# Patient Record
Sex: Male | Born: 1995 | Race: Black or African American | Hispanic: No | Smoking: Current some day smoker
Health system: Southern US, Community
[De-identification: ages and names within clinical notes are randomized; demographics above are authoritative.]

---

## 2016-01-21 ENCOUNTER — Emergency Department (HOSPITAL_COMMUNITY): Payer: Self-pay

## 2016-01-21 ENCOUNTER — Emergency Department (HOSPITAL_COMMUNITY): Payer: No Typology Code available for payment source | Admitting: Anesthesiology

## 2016-01-21 ENCOUNTER — Encounter (HOSPITAL_COMMUNITY)
Admission: EM | Disposition: A | Discharge status: Home / Self Care | Payer: Self-pay | Source: Home / Self Care | Attending: Emergency Medicine

## 2016-01-21 ENCOUNTER — Ambulatory Visit (HOSPITAL_COMMUNITY)
Admission: EM | Admit: 2016-01-21 | Discharge: 2016-01-21 | Disposition: A | Discharge status: Home / Self Care | Payer: Self-pay | Attending: Emergency Medicine | Admitting: Emergency Medicine

## 2016-01-21 ENCOUNTER — Encounter (HOSPITAL_COMMUNITY): Payer: Self-pay | Admitting: Emergency Medicine

## 2016-01-21 DIAGNOSIS — S022XXA Fracture of nasal bones, initial encounter for closed fracture: Secondary | ICD-10-CM | POA: Insufficient documentation

## 2016-01-21 DIAGNOSIS — S01112A Laceration without foreign body of left eyelid and periocular area, initial encounter: Secondary | ICD-10-CM | POA: Insufficient documentation

## 2016-01-21 DIAGNOSIS — F17200 Nicotine dependence, unspecified, uncomplicated: Secondary | ICD-10-CM | POA: Insufficient documentation

## 2016-01-21 DIAGNOSIS — S0121XA Laceration without foreign body of nose, initial encounter: Secondary | ICD-10-CM | POA: Insufficient documentation

## 2016-01-21 DIAGNOSIS — S022XXB Fracture of nasal bones, initial encounter for open fracture: Secondary | ICD-10-CM

## 2016-01-21 DIAGNOSIS — S01511A Laceration without foreign body of lip, initial encounter: Secondary | ICD-10-CM | POA: Insufficient documentation

## 2016-01-21 DIAGNOSIS — S0003XA Contusion of scalp, initial encounter: Secondary | ICD-10-CM | POA: Insufficient documentation

## 2016-01-21 DIAGNOSIS — J342 Deviated nasal septum: Secondary | ICD-10-CM

## 2016-01-21 DIAGNOSIS — S0181XA Laceration without foreign body of other part of head, initial encounter: Secondary | ICD-10-CM | POA: Insufficient documentation

## 2016-01-21 DIAGNOSIS — S0182XA Laceration with foreign body of other part of head, initial encounter: Secondary | ICD-10-CM

## 2016-01-21 DIAGNOSIS — S01521A Laceration with foreign body of lip, initial encounter: Secondary | ICD-10-CM

## 2016-01-21 HISTORY — PX: FACIAL LACERATION REPAIR: SHX6589

## 2016-01-21 LAB — COMPREHENSIVE METABOLIC PANEL
ALT: 24 U/L (ref 17–63)
ANION GAP: 13 (ref 5–15)
AST: 24 U/L (ref 15–41)
Albumin: 4.7 g/dL (ref 3.5–5.0)
Alkaline Phosphatase: 97 U/L (ref 38–126)
BILIRUBIN TOTAL: 0.3 mg/dL (ref 0.3–1.2)
BUN: 11 mg/dL (ref 6–20)
CO2: 27 mmol/L (ref 22–32)
CREATININE: 1.18 mg/dL (ref 0.61–1.24)
Calcium: 9.7 mg/dL (ref 8.9–10.3)
Chloride: 103 mmol/L (ref 101–111)
GFR calc Af Amer: >60 mL/min (ref >60)
Glucose, Bld: 116 mg/dL — ABNORMAL HIGH (ref 65–99)
POTASSIUM: 4.2 mmol/L (ref 3.5–5.1)
Sodium: 143 mmol/L (ref 135–145)
Total Protein: 7.1 g/dL (ref 6.5–8.1)

## 2016-01-21 LAB — URINALYSIS, ROUTINE W REFLEX MICROSCOPIC
Bilirubin Urine: NEGATIVE
Glucose, UA: NEGATIVE mg/dL
Hgb urine dipstick: NEGATIVE
Ketones, ur: NEGATIVE mg/dL
LEUKOCYTES UA: NEGATIVE
NITRITE: NEGATIVE
PROTEIN: NEGATIVE mg/dL
Specific Gravity, Urine: 1.028 (ref 1.005–1.030)
pH: 6 (ref 5.0–8.0)

## 2016-01-21 LAB — I-STAT CHEM 8, ED
BUN: 15 mg/dL (ref 6–20)
CREATININE: 1.4 mg/dL — AB (ref 0.61–1.24)
Calcium, Ion: 1.21 mmol/L (ref 1.12–1.23)
Chloride: 102 mmol/L (ref 101–111)
Glucose, Bld: 114 mg/dL — ABNORMAL HIGH (ref 65–99)
HEMATOCRIT: 53 % — AB (ref 39.0–52.0)
HEMOGLOBIN: 18 g/dL — AB (ref 13.0–17.0)
POTASSIUM: 4 mmol/L (ref 3.5–5.1)
SODIUM: 144 mmol/L (ref 135–145)
TCO2: 28 mmol/L (ref 0–100)

## 2016-01-21 LAB — CBC
HCT: 47.8 % (ref 39.0–52.0)
Hemoglobin: 15.9 g/dL (ref 13.0–17.0)
MCH: 29 pg (ref 26.0–34.0)
MCHC: 33.3 g/dL (ref 30.0–36.0)
MCV: 87.2 fL (ref 78.0–100.0)
PLATELETS: 208 10*3/uL (ref 150–400)
RBC: 5.48 MIL/uL (ref 4.22–5.81)
RDW: 12.9 % (ref 11.5–15.5)
WBC: 6.3 10*3/uL (ref 4.0–10.5)

## 2016-01-21 LAB — ETHANOL: Alcohol, Ethyl (B): 183 mg/dL — ABNORMAL HIGH (ref <5)

## 2016-01-21 LAB — PROTIME-INR
INR: 1.1 (ref 0.00–1.49)
Prothrombin Time: 14.4 seconds (ref 11.6–15.2)

## 2016-01-21 LAB — SAMPLE TO BLOOD BANK

## 2016-01-21 SURGERY — REPAIR, LACERATION, FACE
Anesthesia: General | Site: Face

## 2016-01-21 MED ORDER — FENTANYL CITRATE (PF) 250 MCG/5ML IJ SOLN
INTRAMUSCULAR | Status: AC
  Filled 2016-01-21: qty 5

## 2016-01-21 MED ORDER — DEXMEDETOMIDINE HCL IN NACL 200 MCG/50ML IV SOLN
INTRAVENOUS | Status: DC | PRN
  Administered 2016-01-21: .5 ug/kg/h via INTRAVENOUS

## 2016-01-21 MED ORDER — BACITRACIN ZINC 500 UNIT/GM EX OINT
TOPICAL_OINTMENT | CUTANEOUS | Status: AC
  Filled 2016-01-21: qty 28.35

## 2016-01-21 MED ORDER — ONDANSETRON HCL 4 MG/2ML IJ SOLN
INTRAMUSCULAR | Status: AC
  Filled 2016-01-21: qty 2

## 2016-01-21 MED ORDER — CLINDAMYCIN HCL 300 MG PO CAPS: 300.0000 mg | ORAL_CAPSULE | Freq: Four times a day (QID) | ORAL | Status: DC

## 2016-01-21 MED ORDER — SODIUM CHLORIDE 0.9 % IR SOLN
Status: DC | PRN
  Administered 2016-01-21: 500 mL

## 2016-01-21 MED ORDER — MIDAZOLAM HCL 2 MG/2ML IJ SOLN
INTRAMUSCULAR | Status: AC
  Filled 2016-01-21: qty 2

## 2016-01-21 MED ORDER — OXYCODONE HCL 5 MG/5ML PO SOLN: 5.0000 mg | Freq: Once | ORAL | Status: DC | PRN

## 2016-01-21 MED ORDER — PROPOFOL 10 MG/ML IV BOLUS
INTRAVENOUS | Status: AC
  Filled 2016-01-21: qty 20

## 2016-01-21 MED ORDER — ONDANSETRON HCL 4 MG/2ML IJ SOLN: 4.0000 mg | Freq: Four times a day (QID) | INTRAMUSCULAR | Status: DC | PRN

## 2016-01-21 MED ORDER — FENTANYL CITRATE (PF) 100 MCG/2ML IJ SOLN
INTRAMUSCULAR | Status: DC | PRN
  Administered 2016-01-21 (×2): 50 ug via INTRAVENOUS

## 2016-01-21 MED ORDER — ARTIFICIAL TEARS OP OINT
TOPICAL_OINTMENT | OPHTHALMIC | Status: AC
  Filled 2016-01-21: qty 3.5

## 2016-01-21 MED ORDER — SODIUM CHLORIDE 0.9 % IR SOLN
Status: DC | PRN
  Administered 2016-01-21: 1000 mL

## 2016-01-21 MED ORDER — SODIUM CHLORIDE 0.9 % IV SOLN
1000.0000 mL | INTRAVENOUS | Status: DC
  Administered 2016-01-21: 1000 mL via INTRAVENOUS

## 2016-01-21 MED ORDER — OXYMETAZOLINE HCL 0.05 % NA SOLN
NASAL | Status: AC
  Filled 2016-01-21: qty 15

## 2016-01-21 MED ORDER — BSS IO SOLN
INTRAOCULAR | Status: DC | PRN
  Administered 2016-01-21: 15 mL

## 2016-01-21 MED ORDER — LACTATED RINGERS IV SOLN
INTRAVENOUS | Status: DC | PRN
  Administered 2016-01-21 (×3): via INTRAVENOUS

## 2016-01-21 MED ORDER — OXYMETAZOLINE HCL 0.05 % NA SOLN
NASAL | Status: DC | PRN
  Administered 2016-01-21: 1 via TOPICAL

## 2016-01-21 MED ORDER — OXYCODONE-ACETAMINOPHEN 5-325 MG PO TABS: 1.0000 | ORAL_TABLET | Freq: Four times a day (QID) | ORAL | Status: DC | PRN

## 2016-01-21 MED ORDER — HYDROMORPHONE HCL 1 MG/ML IJ SOLN: 0.2500 mg | INTRAMUSCULAR | Status: DC | PRN

## 2016-01-21 MED ORDER — DEXMEDETOMIDINE HCL IN NACL 200 MCG/50ML IV SOLN
INTRAVENOUS | Status: DC | PRN
  Administered 2016-01-21: 40 ug via INTRAVENOUS

## 2016-01-21 MED ORDER — SODIUM CHLORIDE 0.9 % IJ SOLN
INTRAMUSCULAR | Status: AC
  Filled 2016-01-21: qty 10

## 2016-01-21 MED ORDER — OXYCODONE-ACETAMINOPHEN 5-325 MG PO TABS: 1.0000 | ORAL_TABLET | Freq: Four times a day (QID) | ORAL | Status: AC | PRN

## 2016-01-21 MED ORDER — PROPOFOL 10 MG/ML IV BOLUS
INTRAVENOUS | Status: AC
  Filled 2016-01-21: qty 40

## 2016-01-21 MED ORDER — DOUBLE ANTIBIOTIC 500-10000 UNIT/GM EX OINT
TOPICAL_OINTMENT | CUTANEOUS | Status: AC
  Filled 2016-01-21: qty 1

## 2016-01-21 MED ORDER — ARTIFICIAL TEARS OP OINT
TOPICAL_OINTMENT | OPHTHALMIC | Status: DC | PRN
  Administered 2016-01-21: 1 via OPHTHALMIC

## 2016-01-21 MED ORDER — FENTANYL CITRATE (PF) 100 MCG/2ML IJ SOLN
100.0000 ug | Freq: Once | INTRAMUSCULAR | Status: AC
  Administered 2016-01-21: 100 ug via INTRAVENOUS
  Filled 2016-01-21: qty 2

## 2016-01-21 MED ORDER — PHENYLEPHRINE HCL 10 MG/ML IJ SOLN
INTRAMUSCULAR | Status: DC | PRN
  Administered 2016-01-21: 40 ug via INTRAVENOUS
  Administered 2016-01-21: 80 ug via INTRAVENOUS

## 2016-01-21 MED ORDER — ROCURONIUM BROMIDE 50 MG/5ML IV SOLN
INTRAVENOUS | Status: AC
  Filled 2016-01-21: qty 1

## 2016-01-21 MED ORDER — CLINDAMYCIN HCL 300 MG PO CAPS: 300.0000 mg | ORAL_CAPSULE | Freq: Four times a day (QID) | ORAL | Status: AC

## 2016-01-21 MED ORDER — FENTANYL CITRATE (PF) 100 MCG/2ML IJ SOLN: 50.0000 ug | Freq: Once | INTRAMUSCULAR | Status: DC

## 2016-01-21 MED ORDER — KETOROLAC TROMETHAMINE 30 MG/ML IJ SOLN
30.0000 mg | Freq: Once | INTRAMUSCULAR | Status: AC
  Administered 2016-01-21: 30 mg via INTRAVENOUS
  Filled 2016-01-21: qty 1

## 2016-01-21 MED ORDER — SODIUM CHLORIDE 0.9 % IV SOLN
1000.0000 mL | Freq: Once | INTRAVENOUS | Status: AC
  Administered 2016-01-21: 1000 mL via INTRAVENOUS

## 2016-01-21 MED ORDER — BACITRACIN ZINC 500 UNIT/GM EX OINT
TOPICAL_OINTMENT | CUTANEOUS | Status: DC | PRN
  Administered 2016-01-21: 1 via TOPICAL

## 2016-01-21 MED ORDER — LIDOCAINE HCL (CARDIAC) 20 MG/ML IV SOLN
INTRAVENOUS | Status: DC | PRN
  Administered 2016-01-21: 60 mg via INTRAVENOUS

## 2016-01-21 MED ORDER — LIDOCAINE HCL (CARDIAC) 20 MG/ML IV SOLN
INTRAVENOUS | Status: AC
  Filled 2016-01-21: qty 5

## 2016-01-21 MED ORDER — OXYCODONE HCL 5 MG PO TABS: 5.0000 mg | ORAL_TABLET | Freq: Once | ORAL | Status: DC | PRN

## 2016-01-21 MED ORDER — ONDANSETRON HCL 4 MG/2ML IJ SOLN
INTRAMUSCULAR | Status: DC | PRN
  Administered 2016-01-21: 4 mg via INTRAVENOUS

## 2016-01-21 MED ORDER — SUCCINYLCHOLINE CHLORIDE 20 MG/ML IJ SOLN
INTRAMUSCULAR | Status: AC
  Filled 2016-01-21: qty 1

## 2016-01-21 MED ORDER — IOHEXOL 300 MG/ML  SOLN
100.0000 mL | Freq: Once | INTRAMUSCULAR | Status: AC | PRN
  Administered 2016-01-21: 100 mL via INTRAVENOUS

## 2016-01-21 MED ORDER — LIDOCAINE-EPINEPHRINE 1 %-1:100000 IJ SOLN
INTRAMUSCULAR | Status: AC
  Filled 2016-01-21: qty 1

## 2016-01-21 MED ORDER — PROPOFOL 10 MG/ML IV BOLUS
INTRAVENOUS | Status: DC | PRN
  Administered 2016-01-21: 270 mg via INTRAVENOUS
  Administered 2016-01-21: 100 mg via INTRAVENOUS

## 2016-01-21 MED ORDER — BSS IO SOLN
INTRAOCULAR | Status: AC
  Filled 2016-01-21: qty 15

## 2016-01-21 MED ORDER — EPHEDRINE SULFATE 50 MG/ML IJ SOLN
INTRAMUSCULAR | Status: AC
  Filled 2016-01-21: qty 1

## 2016-01-21 MED ORDER — SUCCINYLCHOLINE CHLORIDE 20 MG/ML IJ SOLN
INTRAMUSCULAR | Status: DC | PRN
  Administered 2016-01-21: 140 mg via INTRAVENOUS

## 2016-01-21 MED ORDER — CLINDAMYCIN PHOSPHATE 600 MG/50ML IV SOLN
600.0000 mg | Freq: Once | INTRAVENOUS | Status: AC
  Administered 2016-01-21: 600 mg via INTRAVENOUS
  Filled 2016-01-21: qty 50

## 2016-01-21 SURGICAL SUPPLY — 39 items
BENZOIN TINCTURE PRP APPL 2/3 (GAUZE/BANDAGES/DRESSINGS) ×3 IMPLANT
BLADE 10 SAFETY STRL DISP (BLADE) IMPLANT
BLADE SURG 15 STRL LF DISP TIS (BLADE) IMPLANT
BLADE SURG 15 STRL SS (BLADE)
BLADE SURG CLIPPER 3M 9600 (MISCELLANEOUS) IMPLANT
CANISTER SUCTION 2500CC (MISCELLANEOUS) ×3 IMPLANT
CLEANER TIP ELECTROSURG 2X2 (MISCELLANEOUS) IMPLANT
CONT SPEC 4OZ CLIKSEAL STRL BL (MISCELLANEOUS) IMPLANT
COVER SURGICAL LIGHT HANDLE (MISCELLANEOUS) ×3 IMPLANT
DRAPE PROXIMA HALF (DRAPES) IMPLANT
DRAPE U 60X70 (DRAPES) ×3 IMPLANT
ELECT COATED BLADE 2.86 ST (ELECTRODE) IMPLANT
ELECT REM PT RETURN 9FT ADLT (ELECTROSURGICAL) ×3
ELECTRODE REM PT RTRN 9FT ADLT (ELECTROSURGICAL) ×1 IMPLANT
GAUZE SPONGE 4X4 16PLY XRAY LF (GAUZE/BANDAGES/DRESSINGS) ×6 IMPLANT
GLOVE ECLIPSE 7.5 STRL STRAW (GLOVE) ×3 IMPLANT
GOWN STRL REUS W/ TWL LRG LVL3 (GOWN DISPOSABLE) ×2 IMPLANT
GOWN STRL REUS W/TWL LRG LVL3 (GOWN DISPOSABLE) ×4
KIT ROOM TURNOVER OR (KITS) ×3 IMPLANT
KIT SPLINT NASAL DENVER PET BE (GAUZE/BANDAGES/DRESSINGS) ×3 IMPLANT
NEEDLE HYPO 25GX1X1/2 BEV (NEEDLE) ×3 IMPLANT
NS IRRIG 1000ML POUR BTL (IV SOLUTION) ×3 IMPLANT
PAD ARMBOARD 7.5X6 YLW CONV (MISCELLANEOUS) ×6 IMPLANT
PENCIL BUTTON HOLSTER BLD 10FT (ELECTRODE) IMPLANT
SPONGE INTESTINAL PEANUT (DISPOSABLE) IMPLANT
SPONGE NEURO XRAY DETECT 1X3 (DISPOSABLE) ×3 IMPLANT
STAPLER VISISTAT 35W (STAPLE) IMPLANT
SUT CHROMIC 4 0 P 3 18 (SUTURE) ×3 IMPLANT
SUT ETHILON 5 0 P 3 18 (SUTURE)
SUT NYLON ETHILON 5-0 P-3 1X18 (SUTURE) IMPLANT
SUT PLAIN 5 0 P 3 18 (SUTURE) ×3 IMPLANT
SUT PROLENE 5 0 PS 2 (SUTURE) ×12 IMPLANT
SUT SILK 2 0 FS (SUTURE) IMPLANT
SUT VIC AB 3-0 SH 27 (SUTURE)
SUT VIC AB 3-0 SH 27XBRD (SUTURE) IMPLANT
SYR CONTROL 10ML LL (SYRINGE) ×3 IMPLANT
TOWEL OR 17X24 6PK STRL BLUE (TOWEL DISPOSABLE) ×6 IMPLANT
TRAY ENT MC OR (CUSTOM PROCEDURE TRAY) ×3 IMPLANT
WATER STERILE IRR 1000ML POUR (IV SOLUTION) ×3 IMPLANT

## 2016-01-21 NOTE — ED Notes (Signed)
Family at beside  

## 2016-01-21 NOTE — Anesthesia Procedure Notes (Signed)
Procedure Name: Intubation Date/Time: 01/21/2016 9:46 AM Performed by: Edmonia CaprioAUSTON, Tyler Oconnell Pre-anesthesia Checklist: Patient identified, Emergency Drugs available, Suction available, Timeout performed and Patient being monitored Patient Re-evaluated:Patient Re-evaluated prior to inductionOxygen Delivery Method: Circle system utilized Preoxygenation: Pre-oxygenation with 100% oxygen Intubation Type: IV induction and Rapid sequence Ventilation: Mask ventilation without difficulty Laryngoscope Size: Miller and 2 Grade View: Grade I Tube type: Oral Laser Tube: Cuffed inflated with minimal occlusive pressure - saline Tube size: 7.5 mm Number of attempts: 1 Airway Equipment and Method: Stylet Placement Confirmation: ETT inserted through vocal cords under direct vision,  positive ETCO2 and breath sounds checked- equal and bilateral Secured at: 24 cm Tube secured with: Tape Dental Injury: Teeth and Oropharynx as per pre-operative assessment  Comments: Old blood in airway otherwise cords clear.
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
## 2016-01-21 NOTE — Progress Notes (Signed)
Orthopedic Tech Progress Note Patient Details:  Tyler Oconnell 08/11/1996 [id number]  Patient ID: Tyler Oconnell, male   DOB: 06/08/1996, 20 y.o.   MRN: [id number] Trauma page  [name], [name] 01/21/2016, 6:12 AM

## 2016-01-21 NOTE — ED Notes (Signed)
Pt transported to CT ?

## 2016-01-21 NOTE — ED Notes (Signed)
Pt returned from CT °

## 2016-01-21 NOTE — ED Notes (Signed)
Pt has a 1.5 inch laceration to his upper lip.
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
## 2016-01-21 NOTE — Discharge Instructions (Signed)
Facial Laceration ° A facial laceration is a cut on the face. These injuries can be painful and cause bleeding. Lacerations usually heal quickly, but they need special care to reduce scarring. °DIAGNOSIS  °Your health care provider will take a medical history, ask for details about how the injury occurred, and examine the wound to determine how deep the cut is. °TREATMENT  °Some facial lacerations may not require closure. Others may not be able to be closed because of an increased risk of infection. The risk of infection and the chance for successful closure will depend on various factors, including the amount of time since the injury occurred. °The wound may be cleaned to help prevent infection. If closure is appropriate, pain medicines may be given if needed. Your health care provider will use stitches (sutures), wound glue (adhesive), or skin adhesive strips to repair the laceration. These tools bring the skin edges together to allow for faster healing and a better cosmetic outcome. If needed, you may also be given a tetanus shot. °HOME CARE INSTRUCTIONS °· Only take over-the-counter or prescription medicines as directed by your health care provider. °· Follow your health care provider's instructions for wound care. These instructions will vary depending on the technique used for closing the wound. °For Sutures: °· Keep the wound clean and dry.   °· If you were given a bandage (dressing), you should change it at least once a day. Also change the dressing if it becomes wet or dirty, or as directed by your health care provider.   °· Wash the wound with soap and water 2 times a day. Rinse the wound off with water to remove all soap. Pat the wound dry with a clean towel.   °· After cleaning, apply a thin layer of the antibiotic ointment recommended by your health care provider. This will help prevent infection and keep the dressing from sticking.   °· You may shower as usual after the first 24 hours. Do not soak the  wound in water until the sutures are removed.   °· Get your sutures removed as directed by your health care provider. With facial lacerations, sutures should usually be taken out after 4-5 days to avoid stitch marks.   °· Wait a few days after your sutures are removed before applying any makeup. °For Skin Adhesive Strips: °· Keep the wound clean and dry.   °· Do not get the skin adhesive strips wet. You may bathe carefully, using caution to keep the wound dry.   °· If the wound gets wet, pat it dry with a clean towel.   °· Skin adhesive strips will fall off on their own. You may trim the strips as the wound heals. Do not remove skin adhesive strips that are still stuck to the wound. They will fall off in time.   °For Wound Adhesive: °· You may briefly wet your wound in the shower or bath. Do not soak or scrub the wound. Do not swim. Avoid periods of heavy sweating until the skin adhesive has fallen off on its own. After showering or bathing, gently pat the wound dry with a clean towel.   °· Do not apply liquid medicine, cream medicine, ointment medicine, or makeup to your wound while the skin adhesive is in place. This may loosen the film before your wound is healed.   °· If a dressing is placed over the wound, be careful not to apply tape directly over the skin adhesive. This may cause the adhesive to be pulled off before the wound is healed.   °· Avoid   prolonged exposure to sunlight or tanning lamps while the skin adhesive is in place. °· The skin adhesive will usually remain in place for 5-10 days, then naturally fall off the skin. Do not pick at the adhesive film.   °After Healing: °Once the wound has healed, cover the wound with sunscreen during the day for 1 full year. This can help minimize scarring. Exposure to ultraviolet light in the first year will darken the scar. It can take 1-2 years for the scar to lose its redness and to heal completely.  °SEEK MEDICAL CARE IF: °· You have a fever. °SEEK IMMEDIATE  MEDICAL CARE IF: °· You have redness, pain, or swelling around the wound.   °· You see a yellowish-white fluid (pus) coming from the wound.   °  °This information is not intended to replace advice given to you by your health care provider. Make sure you discuss any questions you have with your health care provider. °  °Document Released: 12/05/2004 Document Revised: 11/18/2014 Document Reviewed: 06/10/2013 °[employer]vier Interactive Patient Education ©2016 [employer] ° °

## 2016-01-21 NOTE — ED Notes (Signed)
Pt is snoring, he will not respond to voice, will respond to painful  Stimuli.
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
## 2016-01-21 NOTE — ED Notes (Signed)
Dr Suszanne Connerseoh at the bedside.

## 2016-01-21 NOTE — ED Notes (Signed)
Radiology at bedside
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
## 2016-01-21 NOTE — ED Provider Notes (Signed)
I was asked by my attending Dr. Nicanor AlconPalumbo to follow up with the ENT consult by Dr. Suszanne Connerseoh, and await his instruction regarding whether the pt would need to go to the OR for repair or would be discharged home. The home orders and discharge summary were given to me by Dr. Nicanor AlconPalumbo. Dr. Suszanne Connerseoh came and saw pt, and attempted bedside repairs but pt would not tolerate it, so he was admitted and taken to the OR. Please see Dr. Avel Sensoreoh's notes for further documentation of care and dispo.   Allen DerryMercedes Camprubi-Soms, PA-C 01/21/16 0913  April Palumbo, MD 01/21/16 2325

## 2016-01-21 NOTE — Transfer of Care (Signed)
Immediate Anesthesia Transfer of Care Note  Patient: Tyler Oconnell  Procedure(s) Performed: Procedure(s): FACIAL (EYE LID, NASAL, LIP) LACERATION REPAIRS , CLOSED REDUCTION NASAL FRACTURES (N/A)  Patient Location: PACU  Anesthesia Type:General  Level of Consciousness: sedated  Airway & Oxygen Therapy: Patient Spontanous Breathing and Patient connected to face mask oxygen  Post-op Assessment: Report given to RN, Post -op Vital signs reviewed and stable and Patient moving all extremities  Post vital signs: Reviewed and stable  Last Vitals:  Filed Vitals:   01/21/16 0800 01/21/16 0830  BP: 138/77 110/65  Pulse: 100 93  Temp:    Resp: 14 21    Complications: No apparent anesthesia complications

## 2016-01-22 ENCOUNTER — Encounter (HOSPITAL_COMMUNITY): Payer: Self-pay | Admitting: Otolaryngology

## 2016-01-22 NOTE — Addendum Note (Signed)
Addendum  created 01/22/16 0818 by Edmonia CaprioAmanda M Diaz Crago, CRNA   Modules edited: Anesthesia Events, Anesthesia Medication Administration

## 2016-01-22 NOTE — Anesthesia Postprocedure Evaluation (Signed)
Anesthesia Post Note  Patient: Tyler Oconnell  Procedure(s) Performed: Procedure(s) (LRB): FACIAL (EYE LID, NASAL, LIP) LACERATION REPAIRS , CLOSED REDUCTION NASAL FRACTURES (N/A)  Patient location during evaluation: PACU Anesthesia Type: General Level of consciousness: awake and alert and patient cooperative Pain management: pain level controlled Vital Signs Assessment: post-procedure vital signs reviewed and stable Respiratory status: spontaneous breathing and respiratory function stable Cardiovascular status: stable Anesthetic complications: no    Last Vitals:  Filed Vitals:   01/21/16 1310 01/21/16 1321  BP:  123/75  Pulse:  79  Temp: 36.4 C   Resp:  16    Last Pain:  Filed Vitals:   01/21/16 1350  PainSc: 9                  Hakeen Shipes S
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
## 2020-02-25 ENCOUNTER — Emergency Department (HOSPITAL_COMMUNITY)
Admission: EM | Admit: 2020-02-25 | Discharge: 2020-02-25 | Disposition: A | Discharge status: Home / Self Care | Payer: No Typology Code available for payment source | Attending: Emergency Medicine | Admitting: Emergency Medicine

## 2020-02-25 ENCOUNTER — Other Ambulatory Visit: Payer: Self-pay

## 2020-02-25 ENCOUNTER — Emergency Department (HOSPITAL_COMMUNITY): Payer: No Typology Code available for payment source

## 2020-02-25 ENCOUNTER — Encounter (HOSPITAL_COMMUNITY): Payer: Self-pay

## 2020-02-25 DIAGNOSIS — F17210 Nicotine dependence, cigarettes, uncomplicated: Secondary | ICD-10-CM | POA: Diagnosis not present

## 2020-02-25 DIAGNOSIS — Y92410 Unspecified street and highway as the place of occurrence of the external cause: Secondary | ICD-10-CM | POA: Diagnosis not present

## 2020-02-25 DIAGNOSIS — Z79899 Other long term (current) drug therapy: Secondary | ICD-10-CM | POA: Insufficient documentation

## 2020-02-25 DIAGNOSIS — S01312A Laceration without foreign body of left ear, initial encounter: Secondary | ICD-10-CM | POA: Insufficient documentation

## 2020-02-25 DIAGNOSIS — Z23 Encounter for immunization: Secondary | ICD-10-CM | POA: Insufficient documentation

## 2020-02-25 DIAGNOSIS — S0990XA Unspecified injury of head, initial encounter: Secondary | ICD-10-CM | POA: Insufficient documentation

## 2020-02-25 DIAGNOSIS — Y93I9 Activity, other involving external motion: Secondary | ICD-10-CM | POA: Insufficient documentation

## 2020-02-25 DIAGNOSIS — Y999 Unspecified external cause status: Secondary | ICD-10-CM | POA: Insufficient documentation

## 2020-02-25 MED ORDER — LIDOCAINE HCL 2 % IJ SOLN
INTRAMUSCULAR | Status: AC
  Filled 2020-02-25: qty 20

## 2020-02-25 MED ORDER — TETANUS-DIPHTH-ACELL PERTUSSIS 5-2.5-18.5 LF-MCG/0.5 IM SUSP
0.5000 mL | Freq: Once | INTRAMUSCULAR | Status: AC
  Administered 2020-02-25: 0.5 mL via INTRAMUSCULAR
  Filled 2020-02-25: qty 0.5

## 2020-02-25 MED ORDER — METHOCARBAMOL 500 MG PO TABS: 500.0000 mg | ORAL_TABLET | Freq: Two times a day (BID) | ORAL | 0 refills | Status: AC

## 2020-02-25 MED ORDER — OXYCODONE-ACETAMINOPHEN 5-325 MG PO TABS
1.0000 | ORAL_TABLET | Freq: Once | ORAL | Status: AC
  Administered 2020-02-25: 1 via ORAL
  Filled 2020-02-25: qty 1
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
## 2020-02-25 NOTE — ED Notes (Signed)
Transported to CT
